# Patient Record
Sex: Male | Born: 1955 | Race: Black or African American | Hispanic: No | Marital: Married | Smoking: Never smoker
Health system: Southern US, Community
[De-identification: ages and names within clinical notes are randomized; demographics above are authoritative.]

## PROBLEM LIST (undated history)

## (undated) DIAGNOSIS — I10 Essential (primary) hypertension: Secondary | ICD-10-CM

## (undated) DIAGNOSIS — C801 Malignant (primary) neoplasm, unspecified: Secondary | ICD-10-CM

## (undated) DIAGNOSIS — M25839 Other specified joint disorders, unspecified wrist: Secondary | ICD-10-CM

## (undated) HISTORY — PX: PROSTATECTOMY: SHX69

---

## 2014-12-26 ENCOUNTER — Other Ambulatory Visit (HOSPITAL_COMMUNITY): Payer: Self-pay | Admitting: Orthopedic Surgery

## 2014-12-26 DIAGNOSIS — M25831 Other specified joint disorders, right wrist: Secondary | ICD-10-CM

## 2014-12-26 DIAGNOSIS — M79601 Pain in right arm: Secondary | ICD-10-CM

## 2014-12-31 ENCOUNTER — Encounter (HOSPITAL_COMMUNITY)
Admission: RE | Admit: 2014-12-31 | Discharge: 2014-12-31 | Disposition: A | Discharge status: Home / Self Care | Payer: Worker's Compensation | Source: Ambulatory Visit | Attending: Orthopedic Surgery | Admitting: Orthopedic Surgery

## 2014-12-31 ENCOUNTER — Encounter (HOSPITAL_COMMUNITY): Payer: Self-pay

## 2014-12-31 DIAGNOSIS — R937 Abnormal findings on diagnostic imaging of other parts of musculoskeletal system: Secondary | ICD-10-CM | POA: Insufficient documentation

## 2014-12-31 DIAGNOSIS — M79641 Pain in right hand: Secondary | ICD-10-CM | POA: Diagnosis not present

## 2014-12-31 DIAGNOSIS — M79601 Pain in right arm: Secondary | ICD-10-CM

## 2014-12-31 DIAGNOSIS — M25831 Other specified joint disorders, right wrist: Secondary | ICD-10-CM

## 2014-12-31 HISTORY — DX: Malignant (primary) neoplasm, unspecified: C80.1

## 2014-12-31 HISTORY — DX: Essential (primary) hypertension: I10

## 2014-12-31 MED ORDER — TECHNETIUM TC 99M MEDRONATE IV KIT
25.0000 | PACK | Freq: Once | INTRAVENOUS | Status: AC | PRN
  Administered 2014-12-31: 25 via INTRAVENOUS

## 2015-02-08 ENCOUNTER — Other Ambulatory Visit: Payer: Self-pay | Admitting: Orthopedic Surgery

## 2015-03-11 ENCOUNTER — Encounter (HOSPITAL_BASED_OUTPATIENT_CLINIC_OR_DEPARTMENT_OTHER): Payer: Self-pay | Admitting: *Deleted

## 2015-03-14 ENCOUNTER — Ambulatory Visit (HOSPITAL_BASED_OUTPATIENT_CLINIC_OR_DEPARTMENT_OTHER): Payer: Worker's Compensation | Admitting: Anesthesiology

## 2015-03-14 ENCOUNTER — Ambulatory Visit (HOSPITAL_BASED_OUTPATIENT_CLINIC_OR_DEPARTMENT_OTHER)
Admission: RE | Admit: 2015-03-14 | Discharge: 2015-03-14 | Disposition: A | Discharge status: Home / Self Care | Payer: Worker's Compensation | Source: Ambulatory Visit | Attending: Orthopedic Surgery | Admitting: Orthopedic Surgery

## 2015-03-14 ENCOUNTER — Encounter (HOSPITAL_BASED_OUTPATIENT_CLINIC_OR_DEPARTMENT_OTHER): Payer: Self-pay | Admitting: Anesthesiology

## 2015-03-14 ENCOUNTER — Encounter (HOSPITAL_BASED_OUTPATIENT_CLINIC_OR_DEPARTMENT_OTHER)
Admission: RE | Disposition: A | Discharge status: Home / Self Care | Payer: Self-pay | Source: Ambulatory Visit | Attending: Orthopedic Surgery

## 2015-03-14 DIAGNOSIS — W010XXA Fall on same level from slipping, tripping and stumbling without subsequent striking against object, initial encounter: Secondary | ICD-10-CM | POA: Diagnosis not present

## 2015-03-14 DIAGNOSIS — I10 Essential (primary) hypertension: Secondary | ICD-10-CM | POA: Diagnosis not present

## 2015-03-14 DIAGNOSIS — Z6841 Body Mass Index (BMI) 40.0 and over, adult: Secondary | ICD-10-CM | POA: Insufficient documentation

## 2015-03-14 DIAGNOSIS — G5601 Carpal tunnel syndrome, right upper limb: Secondary | ICD-10-CM | POA: Diagnosis not present

## 2015-03-14 DIAGNOSIS — M1990 Unspecified osteoarthritis, unspecified site: Secondary | ICD-10-CM | POA: Diagnosis not present

## 2015-03-14 DIAGNOSIS — E669 Obesity, unspecified: Secondary | ICD-10-CM | POA: Insufficient documentation

## 2015-03-14 DIAGNOSIS — S63511A Sprain of carpal joint of right wrist, initial encounter: Secondary | ICD-10-CM | POA: Insufficient documentation

## 2015-03-14 DIAGNOSIS — M249 Joint derangement, unspecified: Secondary | ICD-10-CM | POA: Diagnosis present

## 2015-03-14 DIAGNOSIS — I4581 Long QT syndrome: Secondary | ICD-10-CM | POA: Insufficient documentation

## 2015-03-14 HISTORY — PX: CARPAL TUNNEL RELEASE: SHX101

## 2015-03-14 HISTORY — DX: Other specified joint disorders, unspecified wrist: M25.839

## 2015-03-14 HISTORY — PX: WRIST ARTHROSCOPY WITH ULNA SHORTENING: SHX5681

## 2015-03-14 LAB — POCT I-STAT, CHEM 8
BUN: 15 mg/dL (ref 6–20)
CALCIUM ION: 1.16 mmol/L (ref 1.12–1.23)
CHLORIDE: 102 mmol/L (ref 101–111)
CREATININE: 1 mg/dL (ref 0.61–1.24)
GLUCOSE: 128 mg/dL — AB (ref 65–99)
HCT: 48 % (ref 39.0–52.0)
Hemoglobin: 16.3 g/dL (ref 13.0–17.0)
Potassium: 3.3 mmol/L — ABNORMAL LOW (ref 3.5–5.1)
Sodium: 141 mmol/L (ref 135–145)
TCO2: 25 mmol/L (ref 0–100)

## 2015-03-14 SURGERY — WRIST ARTHROSCOPY WITH ULNA SHORTENING
Anesthesia: Regional | Laterality: Right

## 2015-03-14 MED ORDER — CEFAZOLIN SODIUM-DEXTROSE 2-3 GM-% IV SOLR: 2.0000 g | INTRAVENOUS | Status: DC

## 2015-03-14 MED ORDER — LACTATED RINGERS IV SOLN
INTRAVENOUS | Status: DC
  Administered 2015-03-14 (×2): via INTRAVENOUS

## 2015-03-14 MED ORDER — ROPIVACAINE HCL 5 MG/ML IJ SOLN
INTRAMUSCULAR | Status: DC | PRN
  Administered 2015-03-14: 20 mL via PERINEURAL

## 2015-03-14 MED ORDER — FENTANYL CITRATE (PF) 100 MCG/2ML IJ SOLN
INTRAMUSCULAR | Status: AC
  Filled 2015-03-14: qty 2

## 2015-03-14 MED ORDER — CEFAZOLIN SODIUM 1-5 GM-% IV SOLN
INTRAVENOUS | Status: AC
  Filled 2015-03-14: qty 50

## 2015-03-14 MED ORDER — FENTANYL CITRATE (PF) 100 MCG/2ML IJ SOLN
25.0000 ug | INTRAMUSCULAR | Status: DC | PRN
  Administered 2015-03-14: 50 ug via INTRAVENOUS

## 2015-03-14 MED ORDER — ONDANSETRON HCL 4 MG/2ML IJ SOLN
INTRAMUSCULAR | Status: DC | PRN
  Administered 2015-03-14: 4 mg via INTRAVENOUS

## 2015-03-14 MED ORDER — LIDOCAINE HCL (CARDIAC) 20 MG/ML IV SOLN
INTRAVENOUS | Status: DC | PRN
  Administered 2015-03-14: 50 mg via INTRAVENOUS

## 2015-03-14 MED ORDER — MIDAZOLAM HCL 2 MG/2ML IJ SOLN
INTRAMUSCULAR | Status: AC
  Filled 2015-03-14: qty 2

## 2015-03-14 MED ORDER — DEXAMETHASONE SODIUM PHOSPHATE 4 MG/ML IJ SOLN
INTRAMUSCULAR | Status: DC | PRN
  Administered 2015-03-14: 10 mg via INTRAVENOUS

## 2015-03-14 MED ORDER — FENTANYL CITRATE (PF) 100 MCG/2ML IJ SOLN
INTRAMUSCULAR | Status: AC
  Filled 2015-03-14: qty 6

## 2015-03-14 MED ORDER — CEFAZOLIN SODIUM-DEXTROSE 2-3 GM-% IV SOLR
INTRAVENOUS | Status: AC
  Filled 2015-03-14: qty 50

## 2015-03-14 MED ORDER — PHENYLEPHRINE HCL 10 MG/ML IJ SOLN
INTRAMUSCULAR | Status: DC | PRN
  Administered 2015-03-14 (×2): 40 ug via INTRAVENOUS
  Administered 2015-03-14: 80 ug via INTRAVENOUS
  Administered 2015-03-14 (×2): 40 ug via INTRAVENOUS

## 2015-03-14 MED ORDER — DEXTROSE 5 % IV SOLN
3.0000 g | INTRAVENOUS | Status: AC
  Administered 2015-03-14: 3 g via INTRAVENOUS

## 2015-03-14 MED ORDER — PROMETHAZINE HCL 25 MG/ML IJ SOLN: 6.2500 mg | INTRAMUSCULAR | Status: DC | PRN

## 2015-03-14 MED ORDER — GLYCOPYRROLATE 0.2 MG/ML IJ SOLN: 0.2000 mg | Freq: Once | INTRAMUSCULAR | Status: DC | PRN

## 2015-03-14 MED ORDER — CHLORHEXIDINE GLUCONATE 4 % EX LIQD: 60.0000 mL | Freq: Once | CUTANEOUS | Status: DC

## 2015-03-14 MED ORDER — PHENYLEPHRINE HCL 10 MG/ML IJ SOLN
10.0000 mg | INTRAVENOUS | Status: DC | PRN
  Administered 2015-03-14: 50 ug/min via INTRAVENOUS

## 2015-03-14 MED ORDER — PROPOFOL 10 MG/ML IV BOLUS
INTRAVENOUS | Status: DC | PRN
  Administered 2015-03-14: 200 mg via INTRAVENOUS
  Administered 2015-03-14: 300 mg via INTRAVENOUS

## 2015-03-14 MED ORDER — FENTANYL CITRATE (PF) 100 MCG/2ML IJ SOLN: 50.0000 ug | INTRAMUSCULAR | Status: DC | PRN

## 2015-03-14 MED ORDER — MIDAZOLAM HCL 2 MG/2ML IJ SOLN
1.0000 mg | INTRAMUSCULAR | Status: DC | PRN
  Administered 2015-03-14: 2 mg via INTRAVENOUS

## 2015-03-14 MED ORDER — SCOPOLAMINE 1 MG/3DAYS TD PT72: 1.0000 | MEDICATED_PATCH | Freq: Once | TRANSDERMAL | Status: DC | PRN

## 2015-03-14 MED ORDER — OXYCODONE-ACETAMINOPHEN 10-325 MG PO TABS: 1.0000 | ORAL_TABLET | ORAL | Status: AC | PRN

## 2015-03-14 SURGICAL SUPPLY — 111 items
BAG DECANTER FOR FLEXI CONT (MISCELLANEOUS) ×3 IMPLANT
BANDAGE ELASTIC 3 VELCRO ST LF (GAUZE/BANDAGES/DRESSINGS) IMPLANT
BANDAGE ELASTIC 4 VELCRO ST LF (GAUZE/BANDAGES/DRESSINGS) IMPLANT
BIT DRILL 2.8X5 QR DISP (BIT) ×3 IMPLANT
BIT DRILL QUICK RELEASE 3.5MM (BIT) ×1 IMPLANT
BLADE AVERAGE 25MMX9MM (BLADE) ×1
BLADE AVERAGE 25X9 (BLADE) ×2 IMPLANT
BLADE CUDA 2.0 (BLADE) IMPLANT
BLADE EAR TYMPAN 2.5 60D BEAV (BLADE) ×3 IMPLANT
BLADE MINI RND TIP GREEN BEAV (BLADE) ×3 IMPLANT
BLADE SAW OSTEOTOMY (BLADE) ×3 IMPLANT
BLADE SURG 15 STRL LF DISP TIS (BLADE) ×1 IMPLANT
BLADE SURG 15 STRL SS (BLADE) ×2
BNDG COHESIVE 3X5 TAN STRL LF (GAUZE/BANDAGES/DRESSINGS) ×6 IMPLANT
BNDG COHESIVE 4X5 TAN STRL (GAUZE/BANDAGES/DRESSINGS) ×3 IMPLANT
BNDG ESMARK 4X9 LF (GAUZE/BANDAGES/DRESSINGS) ×3 IMPLANT
BNDG GAUZE ELAST 4 BULKY (GAUZE/BANDAGES/DRESSINGS) ×3 IMPLANT
BONE TRINITY ELITE 1.2CC SM (Bone Implant) ×3 IMPLANT
BUR CUDA 2.9 (BURR) ×2 IMPLANT
BUR CUDA 2.9MM (BURR) ×1
BUR FULL RADIUS 2.0 (BURR) IMPLANT
BUR FULL RADIUS 2.0MM (BURR)
BUR FULL RADIUS 2.9 (BURR) ×2 IMPLANT
BUR FULL RADIUS 2.9MM (BURR) ×1
BUR GATOR 2.9 (BURR) ×2 IMPLANT
BUR GATOR 2.9MM (BURR) ×1
BUR SPHERICAL 2.9 (BURR) IMPLANT
BUR SPHERICAL 2.9MM (BURR)
CANISTER SUCT 1200ML W/VALVE (MISCELLANEOUS) IMPLANT
CHLORAPREP W/TINT 26ML (MISCELLANEOUS) ×3 IMPLANT
CLOSURE WOUND 1/2 X4 (GAUZE/BANDAGES/DRESSINGS)
CLOSURE WOUND 1/4X4 (GAUZE/BANDAGES/DRESSINGS)
CORDS BIPOLAR (ELECTRODE) ×3 IMPLANT
COVER BACK TABLE 60X90IN (DRAPES) ×3 IMPLANT
COVER MAYO STAND STRL (DRAPES) ×6 IMPLANT
CUFF TOURNIQUET SINGLE 18IN (TOURNIQUET CUFF) IMPLANT
CUFF TOURNIQUET SINGLE 24IN (TOURNIQUET CUFF) ×3 IMPLANT
DRAPE EXTREMITY T 121X128X90 (DRAPE) ×3 IMPLANT
DRAPE OEC MINIVIEW 54X84 (DRAPES) ×3 IMPLANT
DRAPE SURG 17X23 STRL (DRAPES) ×3 IMPLANT
DRAPE U 20/CS (DRAPES) IMPLANT
DRILL QUICK RELEASE 3.5MM (BIT) ×3
DRSG PAD ABDOMINAL 8X10 ST (GAUZE/BANDAGES/DRESSINGS) ×6 IMPLANT
ELECT SMALL JOINT 90D BASC (ELECTRODE) IMPLANT
GAUZE SPONGE 4X4 12PLY STRL (GAUZE/BANDAGES/DRESSINGS) ×3 IMPLANT
GAUZE SPONGE 4X4 16PLY XRAY LF (GAUZE/BANDAGES/DRESSINGS) ×3 IMPLANT
GAUZE XEROFORM 1X8 LF (GAUZE/BANDAGES/DRESSINGS) ×3 IMPLANT
GLOVE BIO SURGEON STRL SZ7.5 (GLOVE) ×3 IMPLANT
GLOVE BIOGEL PI IND STRL 7.0 (GLOVE) ×1 IMPLANT
GLOVE BIOGEL PI IND STRL 8 (GLOVE) ×1 IMPLANT
GLOVE BIOGEL PI IND STRL 8.5 (GLOVE) ×1 IMPLANT
GLOVE BIOGEL PI INDICATOR 7.0 (GLOVE) ×2
GLOVE BIOGEL PI INDICATOR 8 (GLOVE) ×2
GLOVE BIOGEL PI INDICATOR 8.5 (GLOVE) ×2
GLOVE ECLIPSE 6.5 STRL STRAW (GLOVE) ×6 IMPLANT
GLOVE SURG ORTHO 8.0 STRL STRW (GLOVE) ×3 IMPLANT
GOWN STRL REUS W/ TWL LRG LVL3 (GOWN DISPOSABLE) ×1 IMPLANT
GOWN STRL REUS W/TWL LRG LVL3 (GOWN DISPOSABLE) ×2
GOWN STRL REUS W/TWL XL LVL3 (GOWN DISPOSABLE) ×6 IMPLANT
GUIDEWIRE ORTHO 0.054X6 (WIRE) ×6 IMPLANT
IV SET EXTENSION GRAVITY 40 LF (IV SETS) ×3 IMPLANT
MANIFOLD NEPTUNE II (INSTRUMENTS) IMPLANT
NDL SAFETY ECLIPSE 18X1.5 (NEEDLE) ×1 IMPLANT
NEEDLE HYPO 18GX1.5 SHARP (NEEDLE) ×2
NEEDLE HYPO 22GX1.5 SAFETY (NEEDLE) ×3 IMPLANT
NEEDLE PRECISIONGLIDE 27X1.5 (NEEDLE) IMPLANT
NEEDLE SPNL 18GX3.5 QUINCKE PK (NEEDLE) IMPLANT
NEEDLE TUOHY 20GX3.5 (NEEDLE) IMPLANT
NS IRRIG 1000ML POUR BTL (IV SOLUTION) IMPLANT
PACK BASIN DAY SURGERY FS (CUSTOM PROCEDURE TRAY) ×3 IMPLANT
PAD CAST 3X4 CTTN HI CHSV (CAST SUPPLIES) ×2 IMPLANT
PADDING CAST ABS 3INX4YD NS (CAST SUPPLIES) ×2
PADDING CAST ABS 4INX4YD NS (CAST SUPPLIES) ×2
PADDING CAST ABS COTTON 3X4 (CAST SUPPLIES) ×1 IMPLANT
PADDING CAST ABS COTTON 4X4 ST (CAST SUPPLIES) ×1 IMPLANT
PADDING CAST COTTON 3X4 STRL (CAST SUPPLIES) ×4
PLATE ULNAR SHORTENING (Plate) ×3 IMPLANT
ROUTER HOODED VORTEX 2.9MM (BLADE) IMPLANT
SCREW CORTICAL 3.5X20MM (Screw) ×3 IMPLANT
SCREW HEX 3.5X15 NLCKG STRL (Screw) ×1 IMPLANT
SCREW HEX 3.5X15MM (Screw) ×3 IMPLANT
SCREW HEXALOBE NON-LOCK 3.5X14 (Screw) ×6 IMPLANT
SCREW HEXALOBE NON-LOCK 3.5X16 (Screw) ×9 IMPLANT
SET ARTHROSCOPY TUBING (MISCELLANEOUS) ×2
SET ARTHROSCOPY TUBING LN (MISCELLANEOUS) ×1 IMPLANT
SET SM JOINT TUBING/CANN (CANNULA) IMPLANT
SLEEVE SCD COMPRESS KNEE MED (MISCELLANEOUS) ×3 IMPLANT
SLING ARM FOAM STRAP XLG (SOFTGOODS) ×3 IMPLANT
SPLINT PLASTER CAST XFAST 3X15 (CAST SUPPLIES) ×30 IMPLANT
SPLINT PLASTER XTRA FASTSET 3X (CAST SUPPLIES) ×60
STOCKINETTE 4X48 STRL (DRAPES) ×3 IMPLANT
STRIP CLOSURE SKIN 1/2X4 (GAUZE/BANDAGES/DRESSINGS) IMPLANT
STRIP CLOSURE SKIN 1/4X4 (GAUZE/BANDAGES/DRESSINGS) IMPLANT
SUCTION FRAZIER TIP 10 FR DISP (SUCTIONS) IMPLANT
SUT ETHILON 4 0 PS 2 18 (SUTURE) ×6 IMPLANT
SUT ETHILON 5 0 P 3 18 (SUTURE)
SUT MERSILENE 4 0 P 3 (SUTURE) IMPLANT
SUT NYLON ETHILON 5-0 P-3 1X18 (SUTURE) IMPLANT
SUT PDS AB 2-0 CT2 27 (SUTURE) IMPLANT
SUT STEEL 4 0 (SUTURE) IMPLANT
SUT VIC AB 2-0 PS2 27 (SUTURE) ×6 IMPLANT
SUT VICRYL 4-0 PS2 18IN ABS (SUTURE) IMPLANT
SYR BULB 3OZ (MISCELLANEOUS) ×3 IMPLANT
SYR CONTROL 10ML LL (SYRINGE) ×3 IMPLANT
TOWEL OR 17X24 6PK STRL BLUE (TOWEL DISPOSABLE) ×3 IMPLANT
TUBE CONNECTING 20'X1/4 (TUBING) ×1
TUBE CONNECTING 20X1/4 (TUBING) ×2 IMPLANT
UNDERPAD 30X30 (UNDERPADS AND DIAPERS) IMPLANT
WAND 1.5 MICROBLATOR (SURGICAL WAND) ×3 IMPLANT
WATER STERILE IRR 1000ML POUR (IV SOLUTION) ×3 IMPLANT
WIRE TACK PLATE PL-PTACK (WIRE) ×3 IMPLANT

## 2015-03-14 NOTE — Anesthesia Procedure Notes (Addendum)
Anesthesia Regional Block:  Supraclavicular block  Pre-Anesthetic Checklist: ,, timeout performed, Correct Patient, Correct Site, Correct Laterality, Correct Procedure, Correct Position, site marked, Risks and benefits discussed,  Surgical consent,  Pre-op evaluation,  At surgeon's request and post-op pain management  Laterality: Right  Prep: chloraprep       Needles:  Injection technique: Single-shot  Needle Type: Stimiplex          Additional Needles:  Procedures: ultrasound guided (picture in chart)  Motor weakness within 10 minutes. Supraclavicular block Narrative:  Injection made incrementally with aspirations every 5 mL.  Performed by: Personally  Anesthesiologist: JUDD, BENJAMIN  Additional Notes: Risks, benefits and alternative to block explained extensively.  Patient tolerated procedure well, without complications.   Procedure Name: LMA Insertion Date/Time: 03/14/2015 8:47 AM Performed by: Toula Moos L Pre-anesthesia Checklist: Patient identified, Emergency Drugs available, Suction available, Patient being monitored and Timeout performed Patient Re-evaluated:Patient Re-evaluated prior to inductionOxygen Delivery Method: Circle System Utilized Preoxygenation: Pre-oxygenation with 100% oxygen Intubation Type: IV induction Ventilation: Mask ventilation without difficulty LMA: LMA inserted LMA Size: 5.0 Number of attempts: 1 Airway Equipment and Method: Bite block Placement Confirmation: positive ETCO2 Tube secured with: Tape Dental Injury: Teeth and Oropharynx as per pre-operative assessment

## 2015-03-14 NOTE — Progress Notes (Signed)
Assisted Dr Jillyn Hidden with right, ultrasound guided, supraclavicular block. Side rails up, monitors on throughout procedure. See vital signs in flow sheet. Tolerated Procedure well.

## 2015-03-14 NOTE — Transfer of Care (Signed)
Immediate Anesthesia Transfer of Care Note  Patient: Stephen Soto  Procedure(s) Performed: Procedure(s): ARTHROSCOPY RIGHT WRIST DEBRIDEMENT, ULNAR SHORTENING OSTEOTOMY (Right) RIGHT CARPAL TUNNEL RELEASE (Right)  Patient Location: PACU  Anesthesia Type:GA combined with regional for post-op pain  Level of Consciousness: sedated  Airway & Oxygen Therapy: Patient Spontanous Breathing and Patient connected to face mask oxygen  Post-op Assessment: Report given to RN and Post -op Vital signs reviewed and stable  Post vital signs: Reviewed and stable  Last Vitals:  Filed Vitals:   03/14/15 1116  BP:   Pulse: 92  Temp:   Resp: 26    Complications: No apparent anesthesia complications

## 2015-03-14 NOTE — Discharge Instructions (Addendum)
[facility] Instructions Hand Surgery  Wound Care: Keep your hand elevated above the level of your heart.  Do not allow it to dangle by your side.  Keep the dressing dry and do not remove it unless your doctor advises you to do so.  He will usually change it at the time of your post-op visit.  Moving your fingers is advised to stimulate circulation but will depend on the site of your surgery.  If you have a splint applied, your doctor will advise you regarding movement.  Activity: Do not drive or operate machinery today.  Rest today and then you may return to your normal activity and work as indicated by your physician.  Diet:  Drink liquids today or eat a light diet.  You may resume a regular diet tomorrow.    General expectations: Pain for two to three days. Fingers may become slightly swollen.  Call your doctor if any of the following occur: Severe pain not relieved by pain medication. Elevated temperature. Dressing soaked with blood. Inability to move fingers. White or bluish color to fingers.Explained use and side effects of bupropion, such as insomnia, dizziness and dry mouth. Cost is sometimes not covered by insurance, but in the long term, the cost is negligible compared to continuing to smoke cigarettes. Take once daily x 3 days, then BID for 8-12 weeks. Try taking the first pill very early in the morning, and the second pill in early afternoon to avoid insomnia. Set a quit date for 1-2 weeks after starting. Will ask pharmacy to supply the information kit with the drug. Follow up with me to check on progress at quitting smoking.Explained use and side effects of bupropion, such as insomnia, dizziness and dry mouth. Cost is sometimes not covered by insurance, but in the long term, the cost is negligible compared to continuing to smoke cigarettes. Take once daily x 3 days, then BID for 8-12 weeks. Try taking the first pill very early in the morning, and the second pill in early afternoon  to avoid insomnia. Set a quit date for 1-2 weeks after starting. Will ask pharmacy to supply the information kit with the drug. Follow up with me to check on progress at quitting smoking.    Post Anesthesia Home Care Instructions  Activity: Get plenty of rest for the remainder of the day. A responsible adult should stay with you for 24 hours following the procedure.  For the next 24 hours, DO NOT: -Drive a car -Paediatric nurse -Drink alcoholic beverages -Take any medication unless instructed by your physician -Make any legal decisions or sign important papers.  Meals: Start with liquid foods such as gelatin or soup. Progress to regular foods as tolerated. Avoid greasy, spicy, heavy foods. If nausea and/or vomiting occur, drink only clear liquids until the nausea and/or vomiting subsides. Call your physician if vomiting continues.  Special Instructions/Symptoms: Your throat may feel dry or sore from the anesthesia or the breathing tube placed in your throat during surgery. If this causes discomfort, gargle with warm salt water. The discomfort should disappear within 24 hours.  If you had a scopolamine patch placed behind your ear for the management of post- operative nausea and/or vomiting:  1. The medication in the patch is effective for 72 hours, after which it should be removed.  Wrap patch in a tissue and discard in the trash. Wash hands thoroughly with soap and water. 2. You may remove the patch earlier than 72 hours if you experience unpleasant side  effects which may include dry mouth, dizziness or visual disturbances. 3. Avoid touching the patch. Wash your hands with soap and water after contact with the patch.   Regional Anesthesia Blocks  1. Numbness or the inability to move the "blocked" extremity may last from 3-48 hours after placement. The length of time depends on the medication injected and your individual response to the medication. If the numbness is not going away  after 48 hours, call your surgeon.  2. The extremity that is blocked will need to be protected until the numbness is gone and the  Strength has returned. Because you cannot feel it, you will need to take extra care to avoid injury. Because it may be weak, you may have difficulty moving it or using it. You may not know what position it is in without looking at it while the block is in effect.  3. For blocks in the legs and feet, returning to weight bearing and walking needs to be done carefully. You will need to wait until the numbness is entirely gone and the strength has returned. You should be able to move your leg and foot normally before you try and bear weight or walk. You will need someone to be with you when you first try to ensure you do not fall and possibly risk injury.  4. Bruising and tenderness at the needle site are common side effects and will resolve in a few days.  5. Persistent numbness or new problems with movement should be communicated to the surgeon or the [place] [phone] [place] [phone]).

## 2015-03-14 NOTE — H&P (Signed)
Stephen Soto is a 59 year old right hand dominant male referred by Dr. Chauncey Reading for a consultation with respect to pain in his right wrist. He tripped and fell while at work on 06-04-14 while attempting to get around a retired Therapist, occupational. He works in a retirement center. The client was on a motorized wheelchair that had a cord going which he did not see and tripped over the cord. He fell onto his outstretched hand. He was seen at [facility] in [place] where x-rays were taken revealing a fracture of the dorsal aspect of his wrist. He was placed in a splint and referred by Dr. Chauncey Reading who casted him for 4 weeks and then splinted him for another 4 and then began therapy for 5 weeks. He states he has had continued pain in his wrist. He localizes this over the ulnar proximal aspect. He has no prior history of injury. He has been on Lortab and Tylenol. He states he has some discomfort dorsally along the metacarpals. He is wearing his splint intermittently. He complains of a constant, severe, sharp pain with a feeling of swelling, numbness and weakness. He states it is gradually getting worse. His pain level is 8/10 on a visual analog score. He has had his nerve conductions done revealing a significant bilateral carpal tunnel syndrome. He shows a motor delay of 5.1 on the left and 6.7 on the right, sensory delay of 3.0 on the left and 4.1 on the right, amplitude diminution to 16 on the left, 6.7 on the right. He does have the ulnocarpal abutment  PAST MEDICAL HISTORY:  He is allergic to Daypro. He is on Losartan, Lortabs. He had prostate surgery for cancer in December 2015.   FAMILY MEDICAL HISTORY: Positive for heart disease, high BP.  SOCIAL HISTORY:  He does not smoke or use alcohol. 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  REVIEW OF SYSTEMS: Positive for prostate cancer, glasses, high BP, otherwise negative 14 points.  Stephen Soto is an 59 y.o. male.   Chief Complaint: CTS and ulno-carpal abutment right wrist  with pain HPI: see above  Past Medical History  Diagnosis Date  . Cancer     Prostate  . Hypertension   . Ulnocarpal abutment syndrome     rt wrist    Past Surgical History  Procedure Laterality Date  . Prostatectomy      for cancer    History reviewed. No pertinent family history. Social History:  reports that he has never smoked. He does not have any smokeless tobacco history on file. He reports that he does not drink alcohol or use illicit drugs.  Allergies:  Allergies  Allergen Reactions  . Daypro [Oxaprozin]     Medications Prior to Admission  Medication Sig Dispense Refill  . aspirin 81 MG tablet Take 81 mg by mouth daily.    Marland Kitchen ibuprofen (ADVIL,MOTRIN) 200 MG tablet Take 200 mg by mouth every 6 (six) hours as needed.    Marland Kitchen losartan-hydrochlorothiazide (HYZAAR) 100-25 MG per tablet Take 1 tablet by mouth daily.      No results found for this or any previous visit (from the past 48 hour(s)).  No results found.   Pertinent items are noted in HPI.  Blood pressure 143/72, pulse 106, temperature 98.4 F (36.9 C), temperature source Oral, resp. rate 20, height 5\' 9"  (1.753 m), weight 135.285 kg (298 lb 4 oz), SpO2 100 %.  General appearance: alert, cooperative and appears stated age Head: Normocephalic, without obvious abnormality  Neck: no JVD Resp: clear to auscultation bilaterally Cardio: regular rate and rhythm, S1, S2 normal, no murmur, click, rub or gallop GI: soft, non-tender; bowel sounds normal; no masses,  no organomegaly Extremities: right wrist pain and numbness Pulses: 2+ and symmetric Skin: Skin color, texture, turgor normal. No rashes or lesions Neurologic: Grossly normal Incision/Wound: na  Assessment/Plan X-rays on film from Dr. Jerrol Banana office reveal a dorsal triquetral avulsion fracture with an ulnocarpal abutment, changes in the ulnar aspect of his lunate.   We have discussed the possibility of release of the carpal tunnels if he elects to  have the ulnar shortening osteotomy done then we would recommend release of the carpal canal on that side. He would like to proceed. Pre, peri and post op care are discussed along with risks and complications. Patient is aware there is no guarantee with surgery, possibility of infection, injury to arteries, nerves, and tendons, incomplete relief and dystrophy. He is advised of the possibility of delayed nonunion to the ulnar shortening osteotomy. He is advised that we cannot relieve all the symptoms of the lunotriquetral tear, the osteotomy is done to try and tighten this. I believe he will not see any loss of mobility and no loss of strength. The average time for healing is 10 weeks for the osteotomy but it can be longer. We would recommend bone graft at the time of surgery. He is scheduled for arthroscopy right wrist with ulnar shortening osteotomy open and carpal tunnel release right hand. He would like to proceed. This is scheduled as an outpatient under regional anesthesia.   [name] 03/14/2015, 7:36 AM

## 2015-03-14 NOTE — Addendum Note (Signed)
Addendum  created 03/14/15 1402 by Jillyn Hidden, MD   Modules edited: Clinical Notes   Clinical Notes:  File: [id number]

## 2015-03-14 NOTE — Anesthesia Preprocedure Evaluation (Addendum)
Anesthesia Evaluation  Patient identified by MRN, date of birth, ID band Patient awake    Reviewed: Allergy & Precautions, H&P , NPO status , Patient's Chart, lab work & pertinent test results  History of Anesthesia Complications Negative for: history of anesthetic complications  Airway Mallampati: IV  TM Distance: >3 FB Neck ROM: full    Dental no notable dental hx.    Pulmonary neg pulmonary ROS,  breath sounds clear to auscultation  Pulmonary exam normal       Cardiovascular hypertension, Pt. on medications negative cardio ROS Normal cardiovascular examRhythm:regular Rate:Normal     Neuro/Psych negative neurological ROS     GI/Hepatic negative GI ROS, Neg liver ROS,   Endo/Other  negative endocrine ROS  Renal/GU negative Renal ROS     Musculoskeletal   Abdominal (+) + obese,   Peds  Hematology negative hematology ROS (+)   Anesthesia Other Findings   Reproductive/Obstetrics negative OB ROS                           Anesthesia Physical Anesthesia Plan  ASA: III  Anesthesia Plan: General and Regional   Post-op Pain Management: GA combined w/ Regional for post-op pain   Induction: Intravenous  Airway Management Planned: LMA  Additional Equipment:   Intra-op Plan:   Post-operative Plan:   Informed Consent: I have reviewed the patients History and Physical, chart, labs and discussed the procedure including the risks, benefits and alternatives for the proposed anesthesia with the patient or authorized representative who has indicated his/her understanding and acceptance.     Plan Discussed with: Anesthesiologist, CRNA and Surgeon  Anesthesia Plan Comments:        Anesthesia Quick Evaluation

## 2015-03-14 NOTE — Op Note (Signed)
Dictation Number [id number] Intra-operative fluoroscopic images in the AP, lateral, and oblique views were taken and evaluated by myself.  Reduction and hardware placement were confirmed.

## 2015-03-14 NOTE — Anesthesia Postprocedure Evaluation (Signed)
  Anesthesia Post-op Note  Patient: Stephen Soto  Procedure(s) Performed: Procedure(s) (LRB): ARTHROSCOPY RIGHT WRIST DEBRIDEMENT, ULNAR SHORTENING OSTEOTOMY (Right) RIGHT CARPAL TUNNEL RELEASE (Right)  Patient Location: PACU  Anesthesia Type: General and Regional  Level of Consciousness: awake and alert   Airway and Oxygen Therapy: Patient Spontanous Breathing  Post-op Pain: mild  Post-op Assessment: Post-op Vital signs reviewed, Patient's Cardiovascular Status Stable, Respiratory Function Stable, Patent Airway and No signs of Nausea or vomiting  Last Vitals:  Filed Vitals:   03/14/15 1145  BP: 134/74  Pulse: 84  Temp:   Resp: 22    Post-op Vital Signs: stable   Complications: No apparent anesthesia complications

## 2015-03-14 NOTE — Brief Op Note (Signed)
03/14/2015  11:10 AM  PATIENT:  Stephen Soto  59 y.o. male  PRE-OPERATIVE DIAGNOSIS:  ULNOCARPAL ABUMENT RIGHT WRIST, RIGHT CARPAL TUNNEL SYNDROME  POST-OPERATIVE DIAGNOSIS:  ULNOCARPAL ABUMENT RIGHT WRIST, RIGHT CARPAL TUNNEL SYNDROME  PROCEDURE:  Procedure(s): ARTHROSCOPY RIGHT WRIST DEBRIDEMENT, ULNAR SHORTENING OSTEOTOMY (Right) RIGHT CARPAL TUNNEL RELEASE (Right)  SURGEON:  Surgeon(s) and Role:    * Daryll Brod, MD - Primary  PHYSICIAN ASSISTANT:   ASSISTANTS: K Adea Geisel,MD   ANESTHESIA:   regional and general  EBL:  Total I/O In: 1600 [I.V.:1600] Out: -   BLOOD ADMINISTERED:none  DRAINS: none   LOCAL MEDICATIONS USED:  NONE  SPECIMEN:  No Specimen  DISPOSITION OF SPECIMEN:  N/A  COUNTS:  YES  TOURNIQUET:   Total Tourniquet Time Documented: Upper Arm (Right) - 82 minutes Total: Upper Arm (Right) - 82 minutes   DICTATION: .Other Dictation: Dictation Number [id number]  PLAN OF CARE: Discharge to home after PACU  PATIENT DISPOSITION:  PACU - hemodynamically stable.

## 2015-03-15 ENCOUNTER — Encounter (HOSPITAL_BASED_OUTPATIENT_CLINIC_OR_DEPARTMENT_OTHER): Payer: Self-pay | Admitting: Orthopedic Surgery

## 2015-03-15 NOTE — Op Note (Signed)
NAMEMELANIE, [name]                ACCOUNT NO.:  [id number]  MEDICAL RECORD NO.:  [id number]  LOCATION:                                 FACILITY:  PHYSICIAN:  Daryll Brod, M.D.       DATE OF BIRTH:  1955/12/23  DATE OF PROCEDURE:  03/14/2015 DATE OF DISCHARGE:                              OPERATIVE REPORT   PREOPERATIVE DIAGNOSES:  Ulnocarpal abutment, right wrist.  Carpal tunnel syndrome, right hand.  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  OPERATION:  Arthroscopy with debridement of LT tear, shrinkage of scapholunate tear, debridement of proximal hamate, type 2 lunate. Carpal tunnel release.  Ulnar shortening osteotomy, right forearm.  SURGEON:  Daryll Brod, M.D.  ASSISTANT:  Leanora Cover, M.D.  ANESTHESIA:  Supraclavicular block general.  HISTORY:  The patient is a 59 year old male who suffered a fall with injury to his right wrist.  He has been treated conservatively without relief.  He has subsequently had a bone scan and MRI revealing a change in his ulnar aspect proximal lunate with a hot bone scan of the distal radioulnar joint indicative of a probable ulnocarpal abutment.  MRI did not show tear of the lunotriquetral ligament or tear of the scapholunate ligament.  No change on the proximal hamate.  He was admitted for ulnar shortening osteotomy with Trinity bone graft, carpal tunnel release.  He is aware that there is no guarantee with the surgery, possibility of infection; recurrence of injury to arteries, nerves, tendons; incomplete relief of symptoms, dystrophy.  In preoperative area, the patient is seen, the extremity marked by both patient and surgeon.  Antibiotic given.  PROCEDURE IN DETAIL:  The patient was brought to the operating room, where a supraclavicular block carried out in the preoperative area, was supplemented with a general anesthetic.  He was  prepped using ChloraPrep, supine position, right arm free.  A 3-minute dry time was allowed.  Time-out taken, confirming the patient and procedure.  The right limb was placed in the Arc arthroscopy tower, 10 pounds of traction applied.  The joint inflated to 3-4 portal.  Transverse incision made through skin only, deepened with a hemostat.  Blunt trocar used to enter the joint.  Joint was inspected from 3-4 portal. Irrigation catheter was placed in 6-view.  The scapholunate ligament showed a proximal tear.  The volar radial wrist ligaments were intact. There was no significant change in the distal radius articular surface. A 4-5 portal was opened after localization with a 22-gauge needle.  The scope was then introduced dorsally and then brought across ulnarly to confirm.  Triangular fibrocartilage appeared to be intact where the large lunotriquetral tear was present.  This was probed with a probe. The scope introduced in the ulnar 4-5 portal.  The lunotriquetral tear inspected.  This was then removed, placed in the 3-4 portal and a debridement performed with a full radius Gator shaver of the lunotriquetral tear.  Very significant peripheral synovitis was present. This was then cleared with an ArthroWand.  The scope reintroduced in 4-5 and a shrinkage of the scapholunate ligament was performed with the  ArthroWand in the mid joint, it was then inspected.  Significant instability of lunotriquetral joint was noted.  Significant arthritic changes were present on the proximal hamate.  A radial midcarpal portal was opened after the ulnar midcarpal portal had been opened.  Each one was deepened with a hemostat.  A transverse incision was used.  A blunt trocar was used to enter the joint.  The proximal capitate showed no changes.  The proximal hamate was then debrided with the Gator shaver. The instruments were removed.  The limb exsanguinated with an Esmarch bandage after removal from the Arc  arthroscopy tower.  A longitudinal incision was made on the ulnar shaft, carried down through subcutaneous tissue.  He had an extremely large flexor carpi ulnaris.  The dorsal sensory branch of the ulnar nerve was identified and protected.  The dissection carried between the flexor carpi ulnaris, extensor carpi ulnaris.  The pronator quadratus was incised off along with the deep flexors to the fingers proximally.  Acumed ulnar shortening plate was then placed.  This required pending and that it toggled over a portion of his bone.  His bone was extremely large, approximately the size of average person's radius.  The distal drill holes were placed and a distal screw placed measuring 14 mm.  A proximal toggle was then placed along with the guiding with a compression guide, placed after drilling holes.  The distal remaining holes were then drilled.  The osteotomy guide was then placed.  A 3 mm of shortening was then performed.  This was done with an oscillating saw, maintaining good irrigation to maintain no burning of the bone.  The distal screw was then placed.  The compression device was then inserted after removal of the pins and cutting guide.  The osteotomy could not be compressed.  There was a portion of bone still present dorsally and palmarly.  This was removed with the oscillating saw.  The bone was then able to be compressed with the compression device.  Remaining screws were placed including the oblique screw for compression.  The x-rays revealed that it looked extremely good in position.  Remaining screws measured between 14 and 16 mm with the oblique screw being 20 mm.  This was done with over drilling and compression.  The wound was copiously irrigated with saline.  A __________  collateral oblique direction revealed that the osteotomy was well compressed.  The plate in good position.  The screws were not long. A Trinity 1 mm Elite bone graft was then placed about the osteotomy  site and the wound closed in layers with 2-0 Vicryl and 4-0 nylon sutures.  A separate longitudinal incision was then made in the palm, carried down through subcutaneous tissue.  Bleeders were electrocauterized with bipolar.  Palmar fascia was split.  Superficial palmar arch identified. Flexor tendon of the right little finger identified to the ulnar side of the median nerve.  The carpal retinaculum was incised with sharp dissection.  Right angled and Sewell retractor placed between skin and forearm fascia.  The fascia was released for approximately 2 cm proximal to the wrist crease under direct vision.  Canal was explored.  Air compression to the nerve was apparent.  Motor branch entered in the muscle.  No further lesions were identified.  The wound was irrigated. The skin closed with interrupted 4-0 nylon sutures.  Full flexion and extension of his wrist and elbow and full pronation and supination were noted.  A sterile compressive dressing, long-arm splint applied.  On deflation of the tourniquet, all fingers immediately pinked.  He was taken to the recovery room for observation in satisfactory condition. He will be discharged home to return to [place] in 1 week on Percocet.          ______________________________ Daryll Brod, M.D.     GK/MEDQ  D:  03/14/2015  T:  03/15/2015  Job:  [id number]

## 2015-03-18 ENCOUNTER — Encounter (HOSPITAL_BASED_OUTPATIENT_CLINIC_OR_DEPARTMENT_OTHER): Payer: Self-pay | Admitting: Orthopedic Surgery

## 2015-12-10 IMAGING — NM NM BONE 3 PHASE
6 series · 16 of 16 positions shown · non-contrast
Comparison: None; radiographic correlation:  None available

CLINICAL DATA: Injured RIGHT hand and wrist May 2014 in work
related accident, pain in that area ever since radiating up to elbow

EXAM:
NUCLEAR MEDICINE 3-PHASE BONE SCAN
TECHNIQUE: Radionuclide angiographic images, immediate static blood pool
images, and 3-hour delayed static images were obtained of the hands
and wrists after intravenous injection of radiopharmaceutical.
RADIOPHARMACEUTICALS:  24 mCi of Iechnetium-77m MDP IV

[Series 1: flow · 2.07mm/px · 6 of 40 frames shown (1 of 2)]
[frame 4/40]
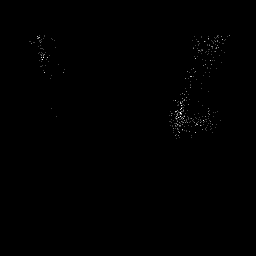
[frame 10/40]
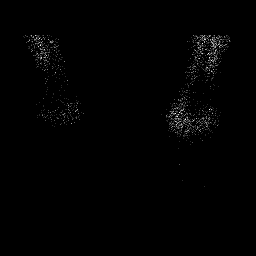
[frame 17/40]
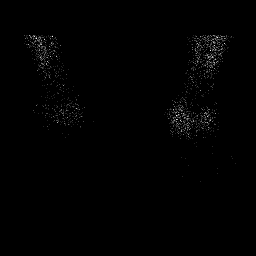
[frame 24/40]
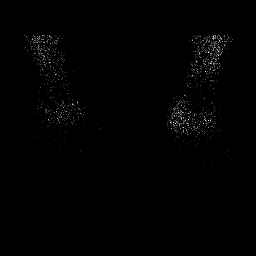
[frame 30/40]
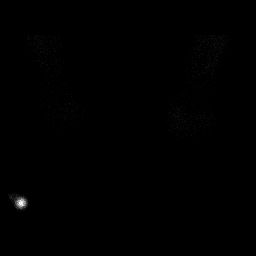
[frame 37/40]
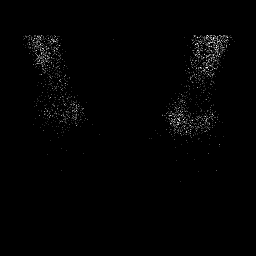

[Series 1: flow · 2.07mm/px · 6 of 40 frames shown (2 of 2)]
[frame 4/40]
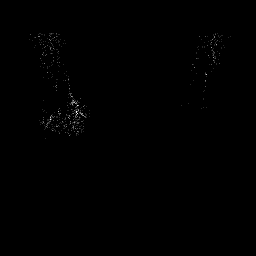
[frame 10/40]
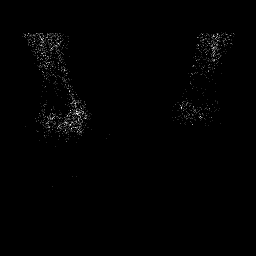
[frame 17/40]
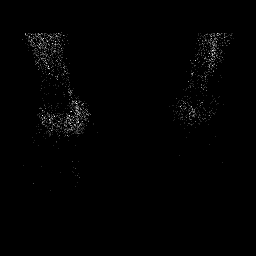
[frame 24/40]
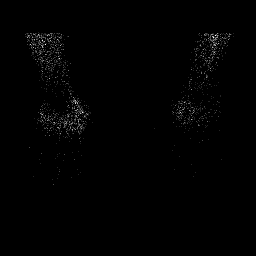
[frame 30/40]
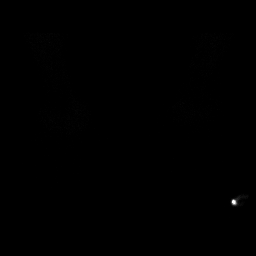
[frame 37/40]
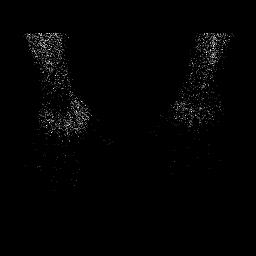

[Series 2: blood pool · 2.07mm/px · 1 of 1 slices shown (1 of 4)]
[im 1/1  full-range]
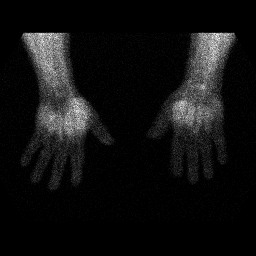

[Series 2: blood pool · 2.07mm/px · 1 of 1 slices shown (2 of 4)]
[im 1/1  full-range]
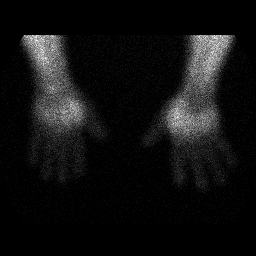

[Series 3: blood pool · 2.07mm/px · 1 of 1 slices shown (3 of 4)]
[im 1/1]
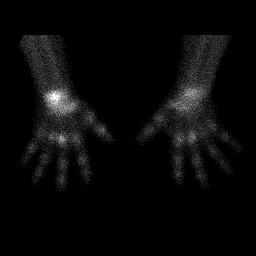

[Series 3: blood pool · 2.07mm/px · 1 of 1 slices shown (4 of 4)]
[im 1/1]
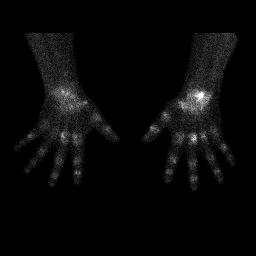

[16 of 16 positions shown; findings below may reference images not displayed]

FINDINGS: Vascular phase: Increased blood flow to LEFT hand/wrist versus RIGHT

Blood pool phase: Minimally increased blood pool LEFT hand versus
RIGHT at the metacarpal and seen are regions.

Delayed phase: Focal delayed increased uptake at the RIGHT wrist at
approximately the distal radioulnar joint. Minimal scattered
increased uptake at MCP and IP joints in both hands particularly the
RIGHT third MCP joint.
IMPRESSION: Uptake at RIGHT third MCP joint and at approximately the RIGHT
distal radioulnar joint which could reflect a degenerative process ;
recommend correlation with radiographs.

Minimal scattered increased uptake at additional MCP and IP joints
bilaterally also likely degenerative in origin.
